# Patient Record
Sex: Male | Born: 1970 | Race: Black or African American | Hispanic: No | Marital: Single | Smoking: Current every day smoker
Health system: Southern US, Community
[De-identification: ages and names within clinical notes are randomized; demographics above are authoritative.]

## PROBLEM LIST (undated history)

## (undated) DIAGNOSIS — I10 Essential (primary) hypertension: Secondary | ICD-10-CM

---

## 2016-09-25 ENCOUNTER — Emergency Department (HOSPITAL_COMMUNITY)
Admission: EM | Admit: 2016-09-25 | Discharge: 2016-09-26 | Disposition: A | Discharge status: Home / Self Care | Payer: Self-pay | Attending: Emergency Medicine | Admitting: Emergency Medicine

## 2016-09-25 ENCOUNTER — Encounter (HOSPITAL_COMMUNITY): Payer: Self-pay | Admitting: *Deleted

## 2016-09-25 DIAGNOSIS — Z79899 Other long term (current) drug therapy: Secondary | ICD-10-CM | POA: Insufficient documentation

## 2016-09-25 DIAGNOSIS — I10 Essential (primary) hypertension: Secondary | ICD-10-CM | POA: Insufficient documentation

## 2016-09-25 DIAGNOSIS — J020 Streptococcal pharyngitis: Secondary | ICD-10-CM | POA: Insufficient documentation

## 2016-09-25 DIAGNOSIS — F17200 Nicotine dependence, unspecified, uncomplicated: Secondary | ICD-10-CM | POA: Insufficient documentation

## 2016-09-25 HISTORY — DX: Essential (primary) hypertension: I10

## 2016-09-25 MED ORDER — PENICILLIN G BENZATHINE 1200000 UNIT/2ML IM SUSP
1.2000 10*6.[IU] | Freq: Once | INTRAMUSCULAR | Status: AC
  Administered 2016-09-25: 1.2 10*6.[IU] via INTRAMUSCULAR
  Filled 2016-09-25: qty 2

## 2016-09-25 MED ORDER — HYDROCODONE-ACETAMINOPHEN 7.5-325 MG/15ML PO SOLN
10.0000 mL | Freq: Once | ORAL | Status: AC
  Administered 2016-09-25: 10 mL via ORAL
  Filled 2016-09-25: qty 15

## 2016-09-25 MED ORDER — DEXAMETHASONE SODIUM PHOSPHATE 4 MG/ML IJ SOLN
10.0000 mg | Freq: Once | INTRAMUSCULAR | Status: AC
  Administered 2016-09-25: 10 mg via INTRAMUSCULAR
  Filled 2016-09-25: qty 3

## 2016-09-25 NOTE — ED Triage Notes (Signed)
Pt c/o sore throat, fever, chills that started yesterday,

## 2016-09-25 NOTE — ED Provider Notes (Signed)
[facility]-EMERGENCY DEPT Provider Note   CSN: [id number] Arrival date & time: 09/25/16  2233    By signing my name below, I, Valentino SaxonBianca Contreras, attest that this documentation has been prepared under the direction and in the presence of Shon Batonourtney F Allan Minotti, MD. Electronically Signed: Valentino SaxonBianca Contreras, ED Scribe. 09/25/16. 11:43 PM.  History   Chief Complaint Chief Complaint  Patient presents with  . Sore Throat    The history is provided by the patient and the spouse. No language interpreter was used.   HPI Comments: Thomas Jefferson is a 46 y.o. male with PMHx of HTN, who presents to the Emergency Department complaining of 10/10, moderate, constant, sore throat onset yesterday. Pt's spouse reports associated chills, subjective fever and otalgia. Pt notes he is sweating profusely. Pt's temperature in the ED today was 100.8. No alleviating factors noted. He states he is not currently taking medications for his blood pressure. Denies congestion, cough, vomiting, diarrhea.   Past Medical History:  Diagnosis Date  . Hypertension     There are no active problems to display for this patient.   History reviewed. No pertinent surgical history.     Home Medications    Prior to Admission medications   Medication Sig Start Date End Date Taking? Authorizing Provider  hydrochlorothiazide (HYDRODIURIL) 25 MG tablet Take 25 mg by mouth daily.   Yes Historical Provider, MD  UNKNOWN TO PATIENT Take 1 tablet by mouth daily. BP medication-name is unknown   Yes Historical Provider, MD  ibuprofen (ADVIL,MOTRIN) 400 MG tablet Take 1 tablet (400 mg total) by mouth every 6 (six) hours as needed. 09/26/16   Shon Batonourtney F Solomia Harrell, MD    Family History No family history on file.  Social History Social History  Substance Use Topics  . Smoking status: Current Every Day Smoker  . Smokeless tobacco: Never Used  . Alcohol use No     Allergies   Patient has no known allergies.   Review of Systems Review of  Systems  Constitutional: Positive for chills and fever.  HENT: Positive for sore throat. Negative for congestion.   Respiratory: Negative for cough.   Cardiovascular: Negative for chest pain.  Gastrointestinal: Negative for diarrhea and vomiting.  All other systems reviewed and are negative.    Physical Exam Updated Vital Signs BP (!) 178/118 (BP Location: Right Arm)   Pulse 106   Temp 100.8 F (38.2 C) (Oral)   Resp 18   Ht 5\' 9"  (1.753 m)   Wt 211 lb (95.7 kg)   SpO2 98%   BMI 31.16 kg/m   Physical Exam  Constitutional: He is oriented to person, place, and time. He appears well-developed and well-nourished. No distress.  HENT:  Head: Normocephalic and atraumatic.  Mouth/Throat: Oropharyngeal exudate present.  Bilateral tonsillar enlargement with exudate noted, erythema, uvula midline  Eyes: Pupils are equal, round, and reactive to light.  Cardiovascular: Normal rate and regular rhythm.   No murmur heard. Pulmonary/Chest: Effort normal. No respiratory distress.  Musculoskeletal: He exhibits no edema.  Lymphadenopathy:    He has cervical adenopathy.  Neurological: He is alert and oriented to person, place, and time.  Skin: Skin is warm and dry.  Psychiatric: He has a normal mood and affect.  Nursing note and vitals reviewed.    ED Treatments / Results   DIAGNOSTIC STUDIES: Oxygen Saturation is 98% on RA, normal by my interpretation.    COORDINATION OF CARE: 11:32 PM Discussed treatment plan with pt at bedside which includes labs,  oral steroids and antibiotics and pt agreed to plan.   Labs (all labs ordered are listed, but only abnormal results are displayed) Labs Reviewed  CULTURE, GROUP A STREP ([facility])  RAPID STREP SCREEN (NOT AT [facility])    EKG  EKG Interpretation None       Radiology No results found.  Procedures Procedures (including critical care time)  Medications Ordered in ED Medications  penicillin g benzathine (BICILLIN LA) 1200000  UNIT/2ML injection 1.2 Million Units (1.2 Million Units Intramuscular Given 09/25/16 2350)  dexamethasone (DECADRON) injection 10 mg (10 mg Intramuscular Given 09/25/16 2349)  HYDROcodone-acetaminophen (HYCET) 7.5-325 mg/15 ml solution 10 mL (10 mLs Oral Given 09/25/16 2350)     Initial Impression / Assessment and Plan / ED Course  I have reviewed the triage vital signs and the nursing notes.  Pertinent labs & imaging results that were available during my care of the patient were reviewed by me and considered in my medical decision making (see chart for details).  Clinical Course     Patient presents with sore throat. Clinically high suspicion for strep as he is Centor criteria for at a 4. Patient was treated with Bicillin and Decadron. Strep culture pending.  1:06 AM On recheck, patient states he feels better. He is tolerating his secretions. Doubt deep space infection at this time. Will discharge home with ibuprofen for pain. Patient was given strict return precautions.  After history, exam, and medical workup I feel the patient has been appropriately medically screened and is safe for discharge home. Pertinent diagnoses were discussed with the patient. Patient was given return precautions.   Final Clinical Impressions(s) / ED Diagnoses   Final diagnoses:  Strep pharyngitis    New Prescriptions New Prescriptions   IBUPROFEN (ADVIL,MOTRIN) 400 MG TABLET    Take 1 tablet (400 mg total) by mouth every 6 (six) hours as needed.   I personally performed the services described in this documentation, which was scribed in my presence. The recorded information has been reviewed and is accurate.     Shon Baton, MD 09/26/16 [phone]

## 2016-09-26 LAB — RAPID STREP SCREEN (MED CTR MEBANE ONLY): STREPTOCOCCUS, GROUP A SCREEN (DIRECT): POSITIVE — AB

## 2016-09-26 MED ORDER — IBUPROFEN 400 MG PO TABS: 400.0000 mg | ORAL_TABLET | Freq: Four times a day (QID) | ORAL | 0 refills | Status: AC | PRN

## 2016-09-26 NOTE — ED Notes (Signed)
Pt ambulatory to waiting room. Pt verbalized understanding of discharge instructions.   

## 2016-09-26 NOTE — ED Notes (Signed)
Pt positive for Strep. Per Dr. Wilkie AyeHorton, cancel the throat culture.

## 2016-09-26 NOTE — Discharge Instructions (Signed)
You were seen today. You likely have strep throat. You were treated for strep throat. Use ibuprofen for pain.  I would anticipate he would be getting better in the next 12-24 hours. If you have worsening pain, difficulty swallowing, or any new or worsening symptoms you need to be reevaluated.

## 2017-12-26 ENCOUNTER — Encounter (HOSPITAL_COMMUNITY): Payer: Self-pay | Admitting: Emergency Medicine

## 2017-12-26 ENCOUNTER — Other Ambulatory Visit: Payer: Self-pay

## 2017-12-26 ENCOUNTER — Emergency Department (HOSPITAL_COMMUNITY)
Admission: EM | Admit: 2017-12-26 | Discharge: 2017-12-26 | Disposition: A | Discharge status: Home / Self Care | Payer: Self-pay | Attending: Emergency Medicine | Admitting: Emergency Medicine

## 2017-12-26 DIAGNOSIS — I10 Essential (primary) hypertension: Secondary | ICD-10-CM

## 2017-12-26 DIAGNOSIS — H6123 Impacted cerumen, bilateral: Secondary | ICD-10-CM

## 2017-12-26 DIAGNOSIS — Z760 Encounter for issue of repeat prescription: Secondary | ICD-10-CM

## 2017-12-26 MED ORDER — CARBAMIDE PEROXIDE 6.5 % OT SOLN
5.0000 [drp] | Freq: Once | OTIC | Status: AC
  Administered 2017-12-26: 5 [drp] via OTIC
  Filled 2017-12-26: qty 15

## 2017-12-26 MED ORDER — HYDROCHLOROTHIAZIDE 25 MG PO TABS: 25.0000 mg | ORAL_TABLET | Freq: Every day | ORAL | 0 refills | Status: DC

## 2017-12-26 MED ORDER — HYDROCHLOROTHIAZIDE 25 MG PO TABS
25.0000 mg | ORAL_TABLET | Freq: Every day | ORAL | Status: DC
  Administered 2017-12-26: 25 mg via ORAL
  Filled 2017-12-26: qty 1

## 2017-12-26 NOTE — ED Provider Notes (Signed)
[facility] EMERGENCY DEPARTMENT Provider Note   CSN: [id number] Arrival date & time: 12/26/17  4098     History   Chief Complaint Chief Complaint  Patient presents with  . Otalgia    HPI Thomas Jefferson is a 47 y.o. male.  HPI   Thomas Jefferson is a 47 y.o. male who presents to the Emergency Department complaining of pain and decreased hearing of the right ear.  Symptoms have been present for 1 day.  He states that he tried to remove wax from his right ear this morning without relief.  He denies using Q-tips.  He also denies recent illness, fever, chills, neck pain, nasal congestion, headache and dizziness.  He also reports a history of hypertension and has been without his antihypertensive medications for "sometime" he denies any associated symptoms with this but is requesting a refill.   Past Medical History:  Diagnosis Date  . Hypertension     There are no active problems to display for this patient.   History reviewed. No pertinent surgical history.      Home Medications    Prior to Admission medications   Medication Sig Start Date End Date Taking? Authorizing Provider  hydrochlorothiazide (HYDRODIURIL) 25 MG tablet Take 25 mg by mouth daily.    [provider]  ibuprofen (ADVIL,MOTRIN) 400 MG tablet Take 1 tablet (400 mg total) by mouth every 6 (six) hours as needed. 09/26/16   Horton, Mayer Masker, MD  UNKNOWN TO PATIENT Take 1 tablet by mouth daily. BP medication-name is unknown    [provider]    Family History History reviewed. No pertinent family history.  Social History Social History   Tobacco Use  . Smoking status: Current Every Day Smoker  . Smokeless tobacco: Never Used  Substance Use Topics  . Alcohol use: No  . Drug use: No     Allergies   Patient has no known allergies.   Review of Systems Review of Systems  Constitutional: Negative for activity change, appetite change, chills and fever.  HENT: Positive for ear pain.  Negative for congestion, ear discharge, facial swelling, rhinorrhea, sore throat and trouble swallowing.   Eyes: Negative for visual disturbance.  Respiratory: Negative for shortness of breath and stridor.   Cardiovascular: Negative for chest pain.  Gastrointestinal: Negative for nausea and vomiting.  Musculoskeletal: Negative for neck pain and neck stiffness.  Skin: Negative for rash.  Neurological: Negative for dizziness, weakness, numbness and headaches.  Hematological: Negative for adenopathy.  Psychiatric/Behavioral: Negative for confusion.  All other systems reviewed and are negative.    Physical Exam Updated Vital Signs BP (!) 164/114 (BP Location: Right Arm)   Pulse 83   Temp 97.8 F (36.6 C) (Oral)   Resp 18   Ht 5\' 7"  (1.702 m)   Wt 97.8 kg (215 lb 8 oz)   SpO2 98%   BMI 33.75 kg/m   Physical Exam  Constitutional: He is oriented to person, place, and time. He appears well-developed and well-nourished. No distress.  HENT:  Head: Normocephalic and atraumatic.  Mouth/Throat: Uvula is midline, oropharynx is clear and moist and mucous membranes are normal. No uvula swelling. No oropharyngeal exudate.  Large amount of cerumen noted to bilateral ears with right greater than left.  Unable to visualize TM on the right.  TM normal appearing on the left.  Eyes: Pupils are equal, round, and reactive to light. EOM are normal.  Neck: Normal range of motion. Neck supple.  Cardiovascular: Normal rate, regular  rhythm and intact distal pulses.  No murmur heard. Pulmonary/Chest: Effort normal and breath sounds normal. No stridor. No respiratory distress.  Musculoskeletal: Normal range of motion.  Lymphadenopathy:    He has no cervical adenopathy.  Neurological: He is alert and oriented to person, place, and time. No sensory deficit. Coordination normal.  Skin: Skin is warm and dry. No rash noted.  Psychiatric: He has a normal mood and affect.  Nursing note and vitals  reviewed.    ED Treatments / Results  Labs (all labs ordered are listed, but only abnormal results are displayed) Labs Reviewed - No data to display  EKG None  Radiology No results found.  Procedures Procedures (including critical care time)  Medications Ordered in ED Medications  carbamide peroxide (DEBROX) 6.5 % OTIC (EAR) solution 5 drop (5 drops Both EARS Given 12/26/17 0849)     Initial Impression / Assessment and Plan / ED Course  I have reviewed the triage vital signs and the nursing notes.  Pertinent labs & imaging results that were available during my care of the patient were reviewed by me and considered in my medical decision making (see chart for details).     Both ears irrigated by nursing staff using saline and Debrox.  Large amount of cerumen removed.  I have also removed a significant amount of cerumen using a lighted curette.  Patient tolerated procedure well.  Reexamination of the bilateral ears reveals normal-appearing TMs.  Patient is hypertensive on this visit, I will refill his hydrochlorothiazide with the understanding that he will follow-up with a local clinic for reevaluation.  Debrox dispensed for home use.  Patient agrees to plan.  Final Clinical Impressions(s) / ED Diagnoses   Final diagnoses:  Bilateral impacted cerumen  Medication refill  Essential hypertension    ED Discharge Orders    None       Pauline Ausriplett, Darrell Leonhardt, PA-C 12/26/17 1031    Bethann BerkshireZammit, Joseph, MD 12/27/17 [phone]

## 2017-12-26 NOTE — ED Triage Notes (Signed)
Pt c/o of ear pain and not being able to hear in his right ear this morning.

## 2017-12-26 NOTE — Discharge Instructions (Addendum)
Apply 5 drops of the Debrox to your right ear twice a day for 3-4 days then use as needed.  Contact 1 of the clinics listed above to establish primary care and to have your blood pressure reevaluated.  Return to the ER for any worsening symptoms.

## 2018-01-04 ENCOUNTER — Encounter (HOSPITAL_COMMUNITY): Payer: Self-pay | Admitting: Emergency Medicine

## 2018-01-04 ENCOUNTER — Other Ambulatory Visit: Payer: Self-pay

## 2018-01-04 ENCOUNTER — Emergency Department (HOSPITAL_COMMUNITY): Payer: Self-pay

## 2018-01-04 ENCOUNTER — Emergency Department (HOSPITAL_COMMUNITY)
Admission: EM | Admit: 2018-01-04 | Discharge: 2018-01-04 | Disposition: A | Discharge status: Home / Self Care | Payer: Self-pay | Attending: Emergency Medicine | Admitting: Emergency Medicine

## 2018-01-04 DIAGNOSIS — R51 Headache: Secondary | ICD-10-CM | POA: Insufficient documentation

## 2018-01-04 DIAGNOSIS — Z79899 Other long term (current) drug therapy: Secondary | ICD-10-CM | POA: Insufficient documentation

## 2018-01-04 DIAGNOSIS — I10 Essential (primary) hypertension: Secondary | ICD-10-CM | POA: Insufficient documentation

## 2018-01-04 DIAGNOSIS — F17200 Nicotine dependence, unspecified, uncomplicated: Secondary | ICD-10-CM | POA: Insufficient documentation

## 2018-01-04 DIAGNOSIS — J209 Acute bronchitis, unspecified: Secondary | ICD-10-CM | POA: Insufficient documentation

## 2018-01-04 DIAGNOSIS — J069 Acute upper respiratory infection, unspecified: Secondary | ICD-10-CM | POA: Insufficient documentation

## 2018-01-04 DIAGNOSIS — R61 Generalized hyperhidrosis: Secondary | ICD-10-CM | POA: Insufficient documentation

## 2018-01-04 MED ORDER — PREDNISONE 20 MG PO TABS
40.0000 mg | ORAL_TABLET | Freq: Once | ORAL | Status: AC
  Administered 2018-01-04: 40 mg via ORAL
  Filled 2018-01-04: qty 2

## 2018-01-04 MED ORDER — PSEUDOEPHEDRINE HCL 60 MG PO TABS
60.0000 mg | ORAL_TABLET | Freq: Once | ORAL | Status: AC
  Administered 2018-01-04: 60 mg via ORAL
  Filled 2018-01-04: qty 1

## 2018-01-04 MED ORDER — ALBUTEROL SULFATE (2.5 MG/3ML) 0.083% IN NEBU
2.5000 mg | INHALATION_SOLUTION | Freq: Once | RESPIRATORY_TRACT | Status: AC
  Administered 2018-01-04: 2.5 mg via RESPIRATORY_TRACT
  Filled 2018-01-04: qty 3

## 2018-01-04 MED ORDER — ALBUTEROL SULFATE HFA 108 (90 BASE) MCG/ACT IN AERS
2.0000 | INHALATION_SPRAY | Freq: Once | RESPIRATORY_TRACT | Status: AC
  Administered 2018-01-04: 2 via RESPIRATORY_TRACT
  Filled 2018-01-04: qty 6.7

## 2018-01-04 MED ORDER — AMLODIPINE BESYLATE 5 MG PO TABS
10.0000 mg | ORAL_TABLET | Freq: Once | ORAL | Status: AC
  Administered 2018-01-04: 10 mg via ORAL
  Filled 2018-01-04: qty 2

## 2018-01-04 MED ORDER — DEXAMETHASONE 4 MG PO TABS: 4.0000 mg | ORAL_TABLET | Freq: Two times a day (BID) | ORAL | 0 refills | Status: AC

## 2018-01-04 MED ORDER — LORATADINE-PSEUDOEPHEDRINE ER 5-120 MG PO TB12: 1.0000 | ORAL_TABLET | Freq: Two times a day (BID) | ORAL | 0 refills | Status: AC

## 2018-01-04 NOTE — ED Notes (Signed)
Rt GIVING TX

## 2018-01-04 NOTE — Discharge Instructions (Addendum)
Please increase fluids.  Please wash hands frequently.  Usual mask until symptoms have resolved.  Your blood pressure is elevated today, however you have not had your blood pressure medication for today.  Please establish a primary physician with the local health department, [facility], and have them recheck your pressure soon.  Your examination favors an upper respiratory infection with bronchitis.  Please use Decadron 2 times daily with food.  Use your albuterol inhaler 2 puffs every 4 hours, and use Claritin-D for congestion in the nasal and sinus passages 2 times daily.  It is important that you increase fluids.  Please return to the emergency department if any emergent changes in your condition, problems, or concerns.

## 2018-01-04 NOTE — ED Triage Notes (Signed)
Patient complains of cough x 3 days. States coughing up yellow sputum. States he has tried robitussin cough syrup OTC with no improvement. States felling general malaise.

## 2018-01-04 NOTE — ED Notes (Signed)
HB in to reassess 

## 2018-01-04 NOTE — ED Provider Notes (Signed)
[facility] EMERGENCY DEPARTMENT Provider Note   CSN: [id number] Arrival date & time: 01/04/18  [id number]     History   Chief Complaint Chief Complaint  Patient presents with  . Cough    HPI Thomas Jefferson is a 47 y.o. male.  The history is provided by the patient.  Cough  This is a new problem. The current episode started more than 2 days ago. The problem occurs hourly. The problem has been gradually worsening. The cough is productive of sputum. There has been no fever. Associated symptoms include sweats, headaches and wheezing. Pertinent negatives include no chills. He has tried cough syrup for the symptoms. The treatment provided no relief. He is a smoker. His past medical history is significant for pneumonia.    Past Medical History:  Diagnosis Date  . Hypertension     There are no active problems to display for this patient.   History reviewed. No pertinent surgical history.      Home Medications    Prior to Admission medications   Medication Sig Start Date End Date Taking? Authorizing Provider  hydrochlorothiazide (HYDRODIURIL) 25 MG tablet Take 1 tablet (25 mg total) by mouth daily. 12/26/17   Triplett, Tammy, PA-C  ibuprofen (ADVIL,MOTRIN) 400 MG tablet Take 1 tablet (400 mg total) by mouth every 6 (six) hours as needed. 09/26/16   Horton, Mayer Maskerourtney F, MD  UNKNOWN TO PATIENT Take 1 tablet by mouth daily. BP medication-name is unknown    [provider]    Family History No family history on file.  Social History Social History   Tobacco Use  . Smoking status: Current Every Day Smoker  . Smokeless tobacco: Never Used  Substance Use Topics  . Alcohol use: No  . Drug use: No     Allergies   Patient has no known allergies.   Review of Systems Review of Systems  Constitutional: Negative for chills.  HENT: Positive for congestion.   Respiratory: Positive for cough and wheezing.   Neurological: Positive for headaches.     Physical  Exam Updated Vital Signs BP (!) 156/112 (BP Location: Right Arm)   Pulse 88   Temp 98.7 F (37.1 C) (Oral)   Resp 18   Ht 5\' 7"  (1.702 m)   Wt 97.5 kg (215 lb)   SpO2 95%   BMI 33.67 kg/m   Physical Exam  Constitutional: He is oriented to person, place, and time. He appears well-developed and well-nourished.  Non-toxic appearance.  HENT:  Head: Normocephalic.  Right Ear: Tympanic membrane and external ear normal.  Left Ear: Tympanic membrane and external ear normal.  Nasal congestion present.  Eyes: Pupils are equal, round, and reactive to light. EOM and lids are normal.  Neck: Normal range of motion. Neck supple. Carotid bruit is not present.  Cardiovascular: Normal rate, regular rhythm, normal heart sounds, intact distal pulses and normal pulses.  Pulmonary/Chest: No respiratory distress. He has wheezes.  Abdominal: Soft. Bowel sounds are normal. There is no tenderness. There is no guarding.  Musculoskeletal: Normal range of motion.  Lymphadenopathy:       Head (right side): No submandibular adenopathy present.       Head (left side): No submandibular adenopathy present.    He has no cervical adenopathy.  Neurological: He is alert and oriented to person, place, and time. He has normal strength. No cranial nerve deficit or sensory deficit.  Skin: Skin is warm and dry.  Psychiatric: He has a normal mood and affect. His  speech is normal.  Nursing note and vitals reviewed.    ED Treatments / Results  Labs (all labs ordered are listed, but only abnormal results are displayed) Labs Reviewed - No data to display  EKG None  Radiology Dg Chest 2 View  Result Date: 01/04/2018 CLINICAL DATA:  Productive cough for 3 days. Shortness of breath and wheezing. Smoker. EXAM: CHEST - 2 VIEW COMPARISON:  None. FINDINGS: The cardiomediastinal silhouette is within normal limits. The lungs are well inflated with moderate peribronchial thickening. No confluent airspace opacity, sizeable  pleural effusion, or pneumothorax is identified. No acute osseous abnormality is seen. IMPRESSION: Bronchitic changes. Electronically Signed   By: Sebastian Ache M.D.   On: 01/04/2018 11:17    Procedures Procedures (including critical care time)  Medications Ordered in ED Medications - No data to display   Initial Impression / Assessment and Plan / ED Course  I have reviewed the triage vital signs and the nursing notes.  Pertinent labs & imaging results that were available during my care of the patient were reviewed by me and considered in my medical decision making (see chart for details).       Final Clinical Impressions(s) / ED Diagnoses MDM Patient has a 3-day history of cough and congestion.  He is tried over-the-counter medications without success.  No high fevers reported.  No hemoptysis reported at this time.  No recent injury to the chest, and no recent operations or procedures involving the chest area. Blood pressure is elevated throughout the emergency department visit.  I have asked the patient to have this rechecked soon.  The chest x-ray shows no acute abnormalities, but does show some bronchitic changes.  The patient will be treated with steroids, albuterol, and Claritin-D.  The patient is to return to the emergency department if any changes in his condition, problems, or concerns.   Final diagnoses:  Acute bronchitis, unspecified organism  Acute upper respiratory infection    ED Discharge Orders        Ordered    loratadine-pseudoephedrine (CLARITIN-D 12 HOUR) 5-120 MG tablet  2 times daily     01/04/18 1250    dexamethasone (DECADRON) 4 MG tablet  2 times daily with meals     01/04/18 1253       Ivery Quale, PA-C 01/04/18 1258    Mancel Bale, MD 01/05/18 [phone]

## 2018-01-04 NOTE — ED Notes (Signed)
Resting quietly  Awaiting reeval

## 2018-07-13 ENCOUNTER — Other Ambulatory Visit: Payer: Self-pay

## 2018-07-13 ENCOUNTER — Emergency Department (HOSPITAL_COMMUNITY)
Admission: EM | Admit: 2018-07-13 | Discharge: 2018-07-13 | Disposition: A | Discharge status: Home / Self Care | Payer: Self-pay | Attending: Emergency Medicine | Admitting: Emergency Medicine

## 2018-07-13 ENCOUNTER — Encounter (HOSPITAL_COMMUNITY): Payer: Self-pay | Admitting: Emergency Medicine

## 2018-07-13 DIAGNOSIS — Z79899 Other long term (current) drug therapy: Secondary | ICD-10-CM | POA: Insufficient documentation

## 2018-07-13 DIAGNOSIS — F17210 Nicotine dependence, cigarettes, uncomplicated: Secondary | ICD-10-CM | POA: Insufficient documentation

## 2018-07-13 DIAGNOSIS — I10 Essential (primary) hypertension: Secondary | ICD-10-CM | POA: Insufficient documentation

## 2018-07-13 DIAGNOSIS — K0889 Other specified disorders of teeth and supporting structures: Secondary | ICD-10-CM | POA: Insufficient documentation

## 2018-07-13 DIAGNOSIS — K047 Periapical abscess without sinus: Secondary | ICD-10-CM

## 2018-07-13 MED ORDER — PENICILLIN V POTASSIUM 500 MG PO TABS: 500.0000 mg | ORAL_TABLET | Freq: Four times a day (QID) | ORAL | 0 refills | Status: AC

## 2018-07-13 MED ORDER — HYDROCHLOROTHIAZIDE 25 MG PO TABS: 25.0000 mg | ORAL_TABLET | Freq: Every day | ORAL | 0 refills | Status: DC

## 2018-07-13 MED ORDER — HYDROCODONE-ACETAMINOPHEN 5-325 MG PO TABS: 1.0000 | ORAL_TABLET | Freq: Four times a day (QID) | ORAL | 0 refills | Status: AC | PRN

## 2018-07-13 NOTE — Discharge Instructions (Signed)
Please be sure to use the provided resources to ensure appropriate ongoing care with a dentist. Return here for concerning changes in your condition.

## 2018-07-13 NOTE — ED Triage Notes (Signed)
Patient c/o right lower dental with swelling that started yesterday. Denies any fevers. Per patient taking Aleve and ibuprofen with no relief.

## 2018-07-13 NOTE — ED Provider Notes (Signed)
[facility] EMERGENCY DEPARTMENT Provider Note   CSN: [id number] Arrival date & time: 07/13/18  1622     History   Chief Complaint Chief Complaint  Patient presents with  . Dental Pain    HPI Thomas Jefferson is a 47 y.o. male.  HPI  Patient presents with concern of pain, swelling. Onset was within the past day. Location is right lower jaw, anterior, with increasing sense of pressure, pain, sharp, severe in the right lower jaw. Pain prevents him from sleeping, eating. No difficulties speaking or breathing, no abdominal or chest pain. No fever. Patient has known poor dentition. No medication taken for relief has worked thus far.   Past Medical History:  Diagnosis Date  . Hypertension     There are no active problems to display for this patient.   No past surgical history on file.      Home Medications    Prior to Admission medications   Medication Sig Start Date End Date Taking? Authorizing Provider  dexamethasone (DECADRON) 4 MG tablet Take 1 tablet (4 mg total) by mouth 2 (two) times daily with a meal. 01/04/18   Ivery Quale, PA-C  hydrochlorothiazide (HYDRODIURIL) 25 MG tablet Take 1 tablet (25 mg total) by mouth daily. 07/13/18   Gerhard Munch, MD  HYDROcodone-acetaminophen (NORCO/VICODIN) 5-325 MG tablet Take 1 tablet by mouth every 6 (six) hours as needed for severe pain. 07/13/18   Gerhard Munch, MD  ibuprofen (ADVIL,MOTRIN) 400 MG tablet Take 1 tablet (400 mg total) by mouth every 6 (six) hours as needed. 09/26/16   Horton, Mayer Masker, MD  loratadine-pseudoephedrine (CLARITIN-D 12 HOUR) 5-120 MG tablet Take 1 tablet by mouth 2 (two) times daily. 01/04/18   Ivery Quale, PA-C  penicillin v potassium (VEETID) 500 MG tablet Take 1 tablet (500 mg total) by mouth 4 (four) times daily for 7 days. 07/13/18 07/20/18  Gerhard Munch, MD  UNKNOWN TO PATIENT Take 1 tablet by mouth daily. BP medication-name is unknown    [provider]    Family  History No family history on file.  Social History Social History   Tobacco Use  . Smoking status: Current Every Day Smoker    Packs/day: 0.50    Years: 20.00    Pack years: 10.00    Types: Cigarettes  . Smokeless tobacco: Never Used  Substance Use Topics  . Alcohol use: No  . Drug use: No     Allergies   Patient has no known allergies.   Review of Systems Review of Systems  Constitutional:       Per HPI, otherwise negative  HENT:       Per HPI, otherwise negative  Respiratory:       Per HPI, otherwise negative  Cardiovascular:       Per HPI, otherwise negative  Gastrointestinal: Negative for vomiting.  Endocrine:       Negative aside from HPI  Genitourinary:       Neg aside from HPI   Musculoskeletal:       Per HPI, otherwise negative  Skin: Negative.   Neurological: Negative for syncope, weakness and headaches.     Physical Exam Updated Vital Signs BP (!) 159/105 (BP Location: Right Arm)   Pulse 69   Temp 98.1 F (36.7 C) (Oral)   Resp 16   Ht 5\' 9"  (1.753 m)   Wt 95.3 kg   SpO2 96%   BMI 31.01 kg/m   Physical Exam  Constitutional: He is oriented to person,  place, and time. He appears well-developed. No distress.  HENT:  Head: Normocephalic and atraumatic.  Mouth/Throat: Oropharynx is clear and moist. No oropharyngeal exudate.    Eyes: Conjunctivae and EOM are normal.  Cardiovascular: Normal rate and regular rhythm.  Pulmonary/Chest: Effort normal. No stridor. No respiratory distress.  Abdominal: He exhibits no distension.  Musculoskeletal: He exhibits no edema.  Neurological: He is alert and oriented to person, place, and time.  Skin: Skin is warm and dry.  Psychiatric: He has a normal mood and affect.  Nursing note and vitals reviewed.    ED Treatments / Results  Labs (all labs ordered are listed, but only abnormal results are displayed) Labs Reviewed - No data to display  EKG None  Radiology No results  found.  Procedures Procedures (including critical care time)  Medications Ordered in ED Medications - No data to display   Initial Impression / Assessment and Plan / ED Course  I have reviewed the triage vital signs and the nursing notes.  Pertinent labs & imaging results that were available during my care of the patient were reviewed by me and considered in my medical decision making (see chart for details).  Generally well-appearing male presents with new oral pain, was found to have dental infection. No evidence for respiratory complication, no evidence for bacteremia, sepsis. Patient started on analgesia, antibiotics, will follow up with dental.  Final Clinical Impressions(s) / ED Diagnoses   Final diagnoses:  Dental infection    ED Discharge Orders         Ordered    hydrochlorothiazide (HYDRODIURIL) 25 MG tablet  Daily     07/13/18 1646    penicillin v potassium (VEETID) 500 MG tablet  4 times daily     07/13/18 1646    HYDROcodone-acetaminophen (NORCO/VICODIN) 5-325 MG tablet  Every 6 hours PRN     07/13/18 1646           Gerhard Munch, MD 07/13/18 1654

## 2019-05-25 IMAGING — DX DG CHEST 2V
2 series · 2 of 2 positions shown · non-contrast
Comparison: None.

CLINICAL DATA: Productive cough for 3 days. Shortness of breath and
wheezing. Smoker.

EXAM:
CHEST - 2 VIEW

[chest pa]
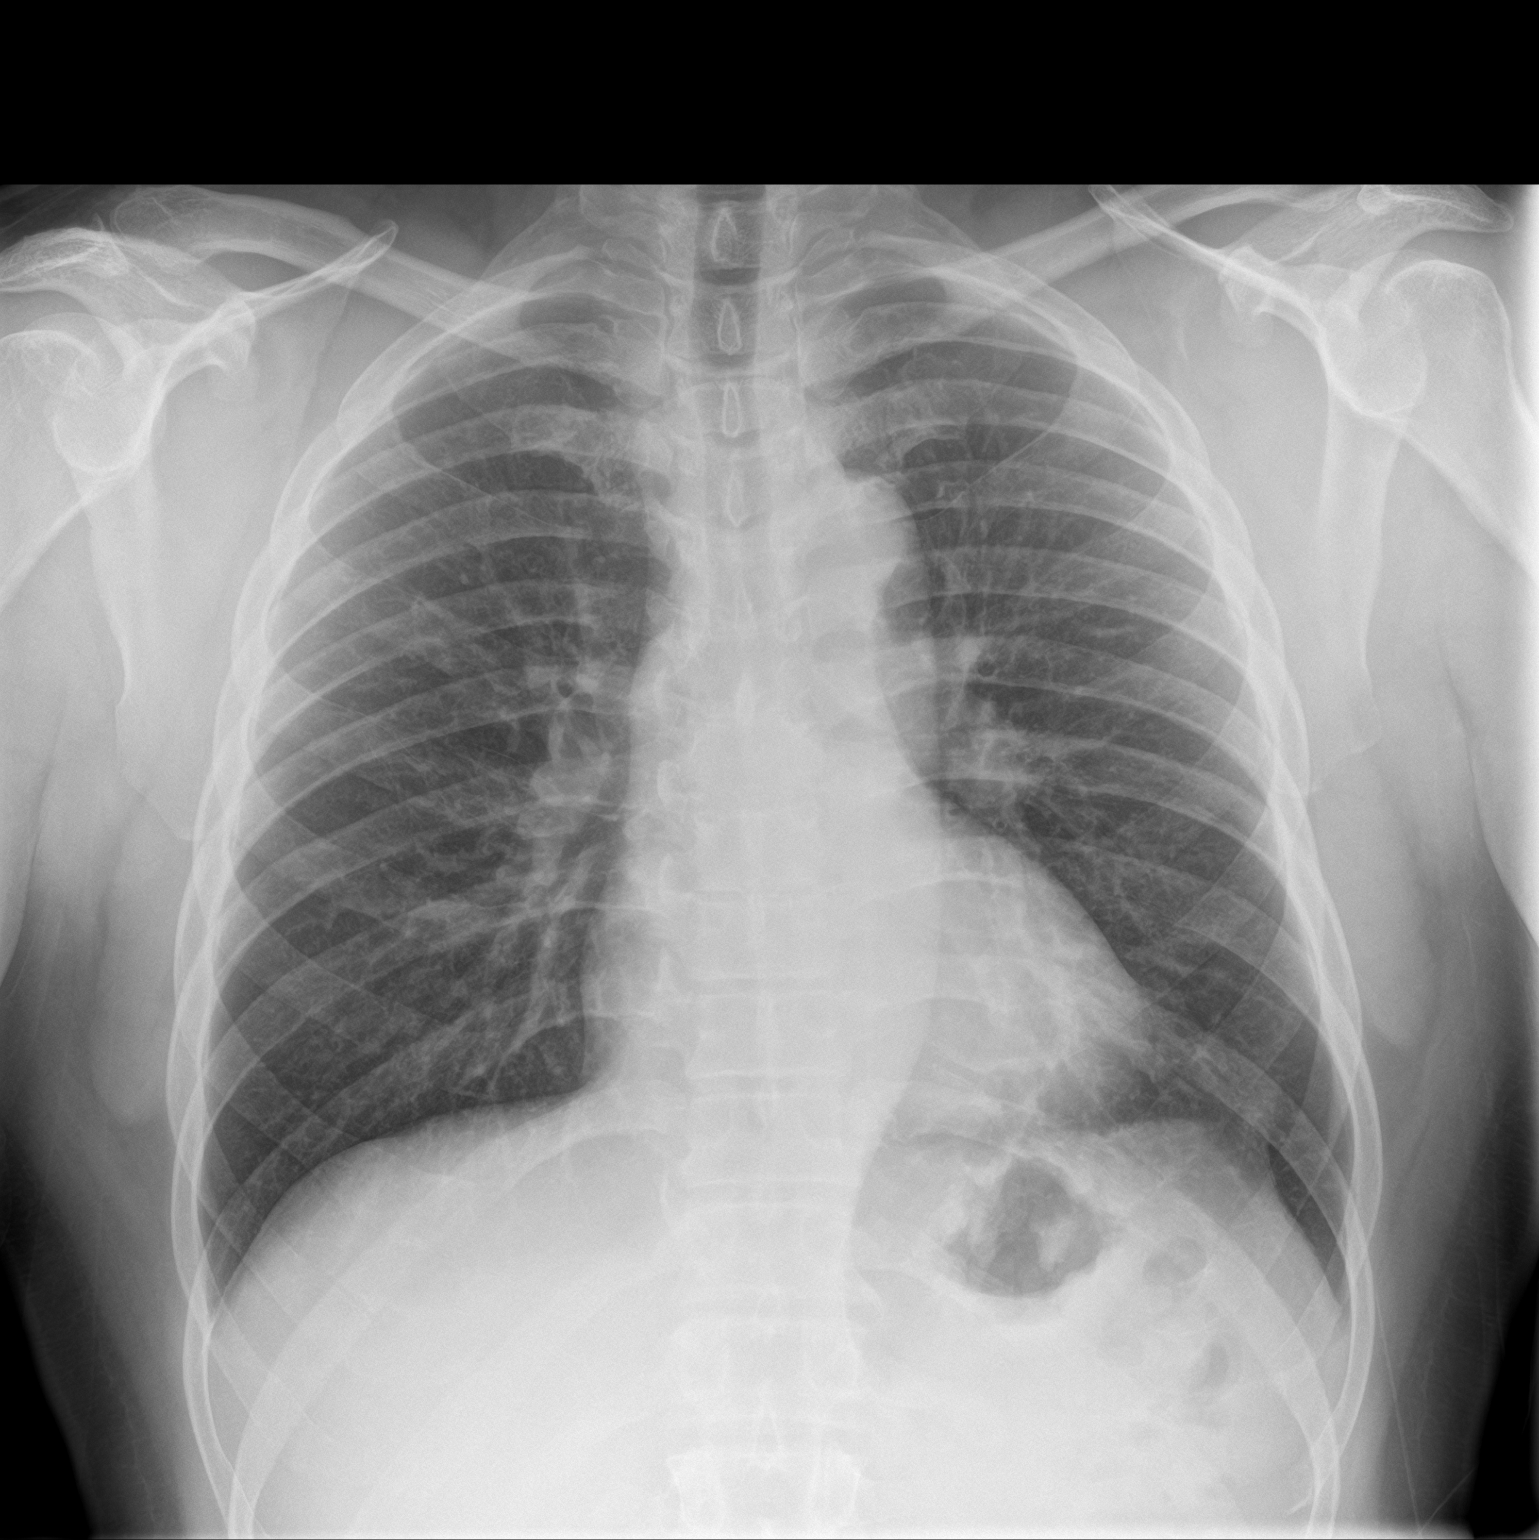

[chest lat]
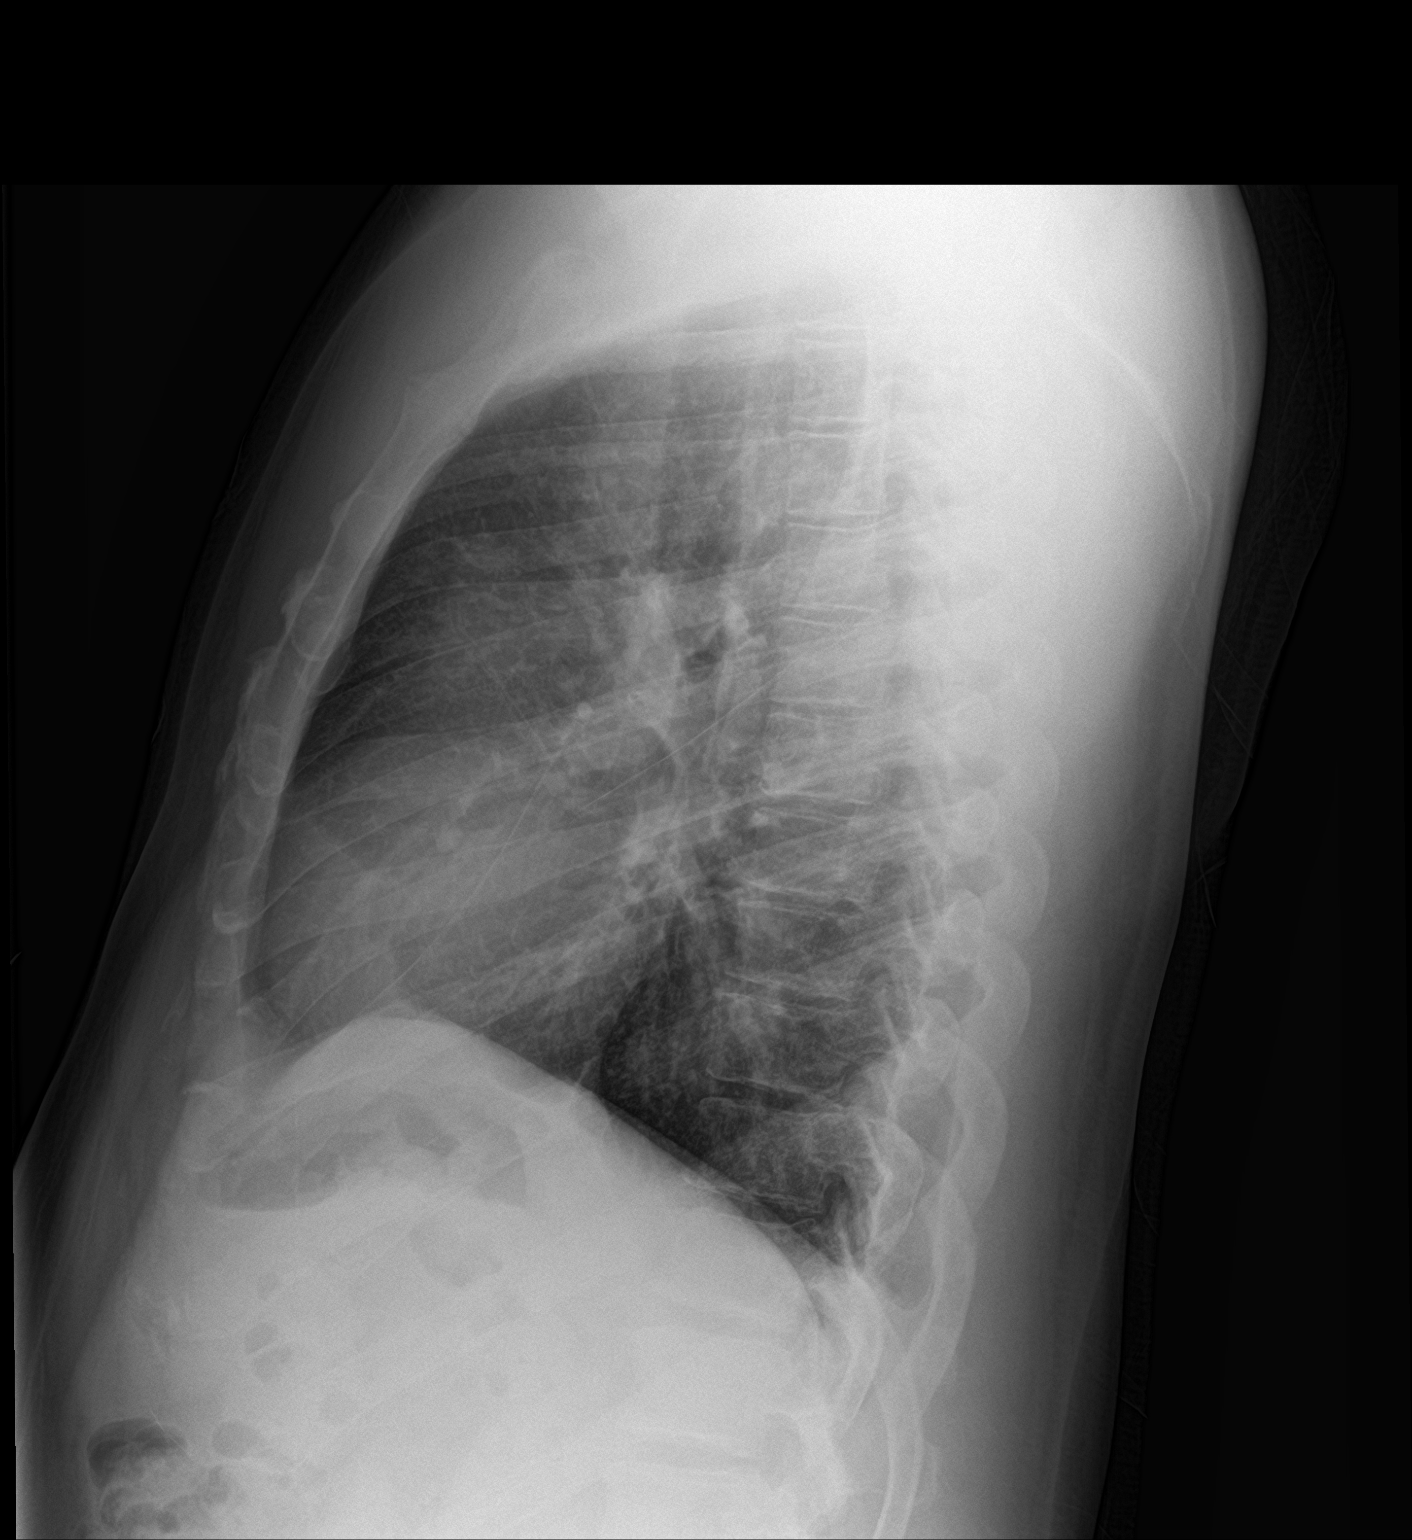

[2 of 2 positions shown; findings below may reference images not displayed]

FINDINGS: The cardiomediastinal silhouette is within normal limits. The lungs
are well inflated with moderate peribronchial thickening. No
confluent airspace opacity, sizeable pleural effusion, or
pneumothorax is identified. No acute osseous abnormality is seen.
IMPRESSION: Bronchitic changes.

## 2024-06-10 ENCOUNTER — Encounter (HOSPITAL_COMMUNITY): Payer: Self-pay

## 2024-06-10 ENCOUNTER — Other Ambulatory Visit: Payer: Self-pay

## 2024-06-10 ENCOUNTER — Emergency Department (HOSPITAL_COMMUNITY): Payer: Self-pay

## 2024-06-10 ENCOUNTER — Emergency Department (HOSPITAL_COMMUNITY)
Admission: EM | Admit: 2024-06-10 | Discharge: 2024-06-10 | Disposition: A | Discharge status: Home / Self Care | Payer: Self-pay | Attending: Emergency Medicine | Admitting: Emergency Medicine

## 2024-06-10 DIAGNOSIS — L02611 Cutaneous abscess of right foot: Secondary | ICD-10-CM

## 2024-06-10 DIAGNOSIS — W268XXA Contact with other sharp object(s), not elsewhere classified, initial encounter: Secondary | ICD-10-CM | POA: Insufficient documentation

## 2024-06-10 DIAGNOSIS — S91331A Puncture wound without foreign body, right foot, initial encounter: Secondary | ICD-10-CM

## 2024-06-10 DIAGNOSIS — L02415 Cutaneous abscess of right lower limb: Secondary | ICD-10-CM | POA: Insufficient documentation

## 2024-06-10 DIAGNOSIS — Z23 Encounter for immunization: Secondary | ICD-10-CM | POA: Insufficient documentation

## 2024-06-10 LAB — COMPREHENSIVE METABOLIC PANEL WITH GFR
ALT: 12 U/L (ref 0–44)
AST: 19 U/L (ref 15–41)
Albumin: 4 g/dL (ref 3.5–5.0)
Alkaline Phosphatase: 69 U/L (ref 38–126)
Anion gap: 9 (ref 5–15)
BUN: 10 mg/dL (ref 6–20)
CO2: 26 mmol/L (ref 22–32)
Calcium: 9.3 mg/dL (ref 8.9–10.3)
Chloride: 104 mmol/L (ref 98–111)
Creatinine, Ser: 0.98 mg/dL (ref 0.61–1.24)
GFR, Estimated: >60 mL/min (ref >60)
Glucose, Bld: 87 mg/dL (ref 70–99)
Potassium: 4.1 mmol/L (ref 3.5–5.1)
Sodium: 138 mmol/L (ref 135–145)
Total Bilirubin: 0.5 mg/dL (ref 0.0–1.2)
Total Protein: 7.6 g/dL (ref 6.5–8.1)

## 2024-06-10 LAB — CBC WITH DIFFERENTIAL/PLATELET
Abs Immature Granulocytes: 0.01 K/uL (ref 0.00–0.07)
Basophils Absolute: 0 K/uL (ref 0.0–0.1)
Basophils Relative: 0 %
Eosinophils Absolute: 0.1 K/uL (ref 0.0–0.5)
Eosinophils Relative: 2 %
HCT: 46.9 % (ref 39.0–52.0)
Hemoglobin: 14.9 g/dL (ref 13.0–17.0)
Immature Granulocytes: 0 %
Lymphocytes Relative: 35 %
Lymphs Abs: 2.4 K/uL (ref 0.7–4.0)
MCH: 30.7 pg (ref 26.0–34.0)
MCHC: 31.8 g/dL (ref 30.0–36.0)
MCV: 96.7 fL (ref 80.0–100.0)
Monocytes Absolute: 0.8 K/uL (ref 0.1–1.0)
Monocytes Relative: 11 %
Neutro Abs: 3.7 K/uL (ref 1.7–7.7)
Neutrophils Relative %: 52 %
Platelets: 175 K/uL (ref 150–400)
RBC: 4.85 MIL/uL (ref 4.22–5.81)
RDW: 12.3 % (ref 11.5–15.5)
WBC: 7.1 K/uL (ref 4.0–10.5)
nRBC: 0 % (ref 0.0–0.2)

## 2024-06-10 MED ORDER — LIDOCAINE-PRILOCAINE 2.5-2.5 % EX CREA
TOPICAL_CREAM | Freq: Once | CUTANEOUS | Status: AC
  Filled 2024-06-10: qty 5

## 2024-06-10 MED ORDER — CIPROFLOXACIN HCL 500 MG PO TABS: 500.0000 mg | ORAL_TABLET | Freq: Two times a day (BID) | ORAL | 0 refills | Status: AC

## 2024-06-10 MED ORDER — CIPROFLOXACIN IN D5W 400 MG/200ML IV SOLN
400.0000 mg | Freq: Once | INTRAVENOUS | Status: AC
  Administered 2024-06-10: 400 mg via INTRAVENOUS
  Filled 2024-06-10: qty 200

## 2024-06-10 MED ORDER — CEFAZOLIN SODIUM-DEXTROSE 2-4 GM/100ML-% IV SOLN
2.0000 g | Freq: Once | INTRAVENOUS | Status: AC
  Administered 2024-06-10: 2 g via INTRAVENOUS
  Filled 2024-06-10: qty 100

## 2024-06-10 MED ORDER — TETANUS-DIPHTH-ACELL PERTUSSIS 5-2.5-18.5 LF-MCG/0.5 IM SUSY
0.5000 mL | PREFILLED_SYRINGE | Freq: Once | INTRAMUSCULAR | Status: AC
  Administered 2024-06-10: 0.5 mL via INTRAMUSCULAR
  Filled 2024-06-10: qty 0.5

## 2024-06-10 MED ORDER — CEPHALEXIN 500 MG PO CAPS: 500.0000 mg | ORAL_CAPSULE | Freq: Four times a day (QID) | ORAL | 0 refills | Status: AC

## 2024-06-10 MED ORDER — MUPIROCIN CALCIUM 2 % EX CREA: 1.0000 | TOPICAL_CREAM | Freq: Two times a day (BID) | CUTANEOUS | 0 refills | Status: AC

## 2024-06-10 MED ORDER — HYDROCHLOROTHIAZIDE 25 MG PO TABS: 25.0000 mg | ORAL_TABLET | Freq: Every day | ORAL | 0 refills | Status: AC

## 2024-06-10 MED ORDER — LIDOCAINE HCL (PF) 1 % IJ SOLN
30.0000 mL | Freq: Once | INTRAMUSCULAR | Status: AC
  Administered 2024-06-10: 30 mL
  Filled 2024-06-10: qty 30

## 2024-06-10 NOTE — Discharge Instructions (Addendum)
 Please take all antibiotics as directed.  Return to the emergency department in 48 hours for a recheck.  Follow-up closely with your primary care doctor.  Resources are listed below.  Return to emergency department immediately for any worsening symptoms.  Please redress the site 2 times daily and placed antibiotic ointment.  [facility] Primary Care Doctor List    Rollene Pesa, MD. Specialty: [facility] Medicine Contact information: [address]  [place] [address]  [phone]   Glendia Fielding, MD. Specialty: [facility] Medicine Contact information: [address]  [place] [address]  [phone]   Benita Outhouse, MD Specialty: Internal Medicine Contact information: [address] [place] [address]  [phone]   Darlyn Hurst, MD. Specialty: Internal Medicine Contact information: [address]  [place] [address]  [phone]    [facility] (Dr. Luke) Specialty: Family Medicine Contact information: [address]  [place] [address]  [phone]   Garnette Lolling, MD. Specialty: [facility] Medicine Contact information: [address]  [address]  [place] [address]  [phone]   Gaither Langton, MD. Specialty: Internal Medicine Contact information: [address]  [address]  [place] [address]  [phone]   [facility]: [address] [phone]  [place], Family medicine [address]  [phone]  [facility] [address] [place], [place] [phone]  [facility]: [address] [phone]    [facility]  [address] [place], [place] [address] [phone]  Services [facility] offers a variety of basic health services.  Services include but are not limited to: Blood pressure checks  Heart rate checks  Blood sugar checks  Urine analysis  Rapid strep tests   Pregnancy tests.  Health education and referrals  People needing more complex services will be directed to a physician online. Using these virtual visits, doctors can evaluate and prescribe medicine and treatments. There will be no medication on-site, though [facility] will help patients fill their prescriptions at little to no cost.   For More information please go to: [email or web address]  Allergy and Asthma:    [address] [place] [phone]  Urology:  [address]  [place] [phone]  [facility]  [address] [place], [place] [phone]  Orthopedics   [address] [place], [place] [phone]  Endocrinology  [address] [place], [place] [phone]  Podiatry: [facility] [phone]

## 2024-06-10 NOTE — ED Triage Notes (Signed)
 Pt arrived via POV from home c/o swelling and pain in right foot where Pt reports on Friday he stepped on a screw, where Pt reports a screw punctured through the bottom of his shoe and into his heel.

## 2024-06-10 NOTE — ED Provider Notes (Signed)
 [place] EMERGENCY DEPARTMENT AT [facility] Provider Note   CSN: [id number] Arrival date & time: 06/10/24  9071     Patient presents with: foreign body in foot   Thomas Jefferson is a 53 y.o. male.   Patient is a 53 year old male who presents to the emergency department the chief complaint of pain and swelling to the plantar aspect of his right foot.  Patient notes approximate 3 days ago he stepped on a screw and notes that it did go through his shoe.  Patient notes that over the past 24 hours he has developed increased swelling and pain to the plantar aspect of the foot.  He denies any associated fever or chills.  He denies any numbness or paresthesias distally.        Prior to Admission medications   Medication Sig Start Date End Date Taking? Authorizing Provider  dexamethasone  (DECADRON ) 4 MG tablet Take 1 tablet (4 mg total) by mouth 2 (two) times daily with a meal. 01/04/18   Armida Culver, PA-C  hydrochlorothiazide  (HYDRODIURIL ) 25 MG tablet Take 1 tablet (25 mg total) by mouth daily. 07/13/18   Garrick Charleston, MD  HYDROcodone -acetaminophen  (NORCO/VICODIN) 5-325 MG tablet Take 1 tablet by mouth every 6 (six) hours as needed for severe pain. 07/13/18   Garrick Charleston, MD  ibuprofen  (ADVIL ,MOTRIN ) 400 MG tablet Take 1 tablet (400 mg total) by mouth every 6 (six) hours as needed. 09/26/16   Horton, Charmaine FALCON, MD  loratadine -pseudoephedrine  (CLARITIN -D 12 HOUR) 5-120 MG tablet Take 1 tablet by mouth 2 (two) times daily. 01/04/18   Armida Culver, PA-C  UNKNOWN TO PATIENT Take 1 tablet by mouth daily. BP medication-name is unknown    [provider]    Allergies: Patient has no known allergies.    Review of Systems  Musculoskeletal:        Pain and swelling to plantar aspect of the right foot  All other systems reviewed and are negative.   Updated Vital Signs BP (!) 152/105 (BP Location: Right Arm)   Pulse 96   Temp 98.3 F (36.8 C) (Oral)   Resp 18    Ht 5' 9 (1.753 m)   Wt 95.7 kg   SpO2 97%   BMI 31.16 kg/m   Physical Exam Vitals and nursing note reviewed.  Constitutional:      Appearance: Normal appearance.  HENT:     Head: Normocephalic and atraumatic.  Eyes:     Extraocular Movements: Extraocular movements intact.     Conjunctiva/sclera: Conjunctivae normal.     Pupils: Pupils are equal, round, and reactive to light.  Cardiovascular:     Rate and Rhythm: Normal rate and regular rhythm.     Pulses: Normal pulses.     Heart sounds: Normal heart sounds.  Pulmonary:     Effort: Pulmonary effort is normal.  Musculoskeletal:        General: Normal range of motion.     Cervical back: Normal range of motion and neck supple.     Comments: Swelling and localized area of induration and fluctuance noted to the plantar aspect of the right foot, swelling and erythema noted to the right foot, DP and PT pulses are 2+ in right foot, sensation intact distally, no bony tenderness noted, full range of motion noted throughout, no obvious deformity or bruising  Skin:    General: Skin is warm and dry.  Neurological:     General: No focal deficit present.     Mental Status:  He is alert and oriented to person, place, and time. Mental status is at baseline.     (all labs ordered are listed, but only abnormal results are displayed) Labs Reviewed  COMPREHENSIVE METABOLIC PANEL WITH GFR  CBC WITH DIFFERENTIAL/PLATELET    EKG: None  Radiology: No results found.   .Incision and Drainage  Date/Time: 06/10/2024 12:32 PM  Performed by: Daralene Lonni BIRCH, PA-C Authorized by: Daralene Lonni BIRCH, PA-C   Consent:    Consent obtained:  Verbal   Consent given by:  Patient   Risks discussed:  Bleeding, incomplete drainage and pain   Alternatives discussed:  No treatment and delayed treatment Universal protocol:    Procedure explained and questions answered to patient or proxy's satisfaction: yes     Immediately prior to procedure, a  time out was called: yes     Patient identity confirmed:  Verbally with patient, arm band, provided demographic data and hospital-assigned identification number Location:    Type:  Abscess   Size:  Small   Location:  Lower extremity   Lower extremity location:  Foot   Foot location:  R foot Pre-procedure details:    Skin preparation:  Povidone-iodine Sedation:    Sedation type:  None Anesthesia:    Anesthesia method:  None Procedure type:    Complexity:  Simple Procedure details:    Ultrasound guidance: no     Needle aspiration: no     Incision types:  Stab incision and single straight   Incision depth:  Dermal   Wound management:  Probed and deloculated   Drainage:  Purulent   Drainage amount:  Moderate   Wound treatment:  Wound left open   Packing materials:  None Post-procedure details:    Procedure completion:  Tolerated well, no immediate complications    Medications Ordered in the ED  lidocaine-prilocaine (EMLA) cream (has no administration in time range)  lidocaine (PF) (XYLOCAINE) 1 % injection 30 mL (has no administration in time range)  ciprofloxacin (CIPRO) IVPB 400 mg (has no administration in time range)  ceFAZolin (ANCEF) IVPB 2g/100 mL premix (has no administration in time range)                                    Medical Decision Making Patient is doing well at this time and is stable for discharge home.  Blood work has been unremarkable at this time and vital signs are stable with no indication for sepsis.  X-ray was concerning for possible retained foreign body versus calcification along the skin.  Do believe that there was a foreign body along the skin which was removed.  Patient is stable for discharge home at this time and will continue oral antibiotics.  Incision and drainage was performed at the bedside and patient tolerated procedure well.  He had no associated lymphatic streaking.  Do not suspect that admission is warranted at this time.  Did discuss  with patient with the need to return to the emergency department in 48 hours for a recheck.  Strict return precautions were discussed for any new or worsening symptoms.  Patient voiced understanding to the plan and had no additional questions.  Continue good wound care was discussed. Patient was fully discussed with attending physician who is in agreement to plan at this time.   Amount and/or Complexity of Data Reviewed Labs: ordered. Radiology: ordered.  Risk Prescription drug management.  Final diagnoses:  None    ED Discharge Orders     None          Daralene Lonni JONETTA DEVONNA 06/10/24 1234    Suzette Pac, MD 06/11/24 1625
